# Patient Record
Sex: Female | Born: 1965 | Race: White | Hispanic: No | Smoking: Former smoker
Health system: Southern US, Community
[De-identification: ages and names within clinical notes are randomized; demographics above are authoritative.]

## PROBLEM LIST (undated history)

## (undated) DIAGNOSIS — K219 Gastro-esophageal reflux disease without esophagitis: Secondary | ICD-10-CM

## (undated) DIAGNOSIS — G43909 Migraine, unspecified, not intractable, without status migrainosus: Secondary | ICD-10-CM

## (undated) DIAGNOSIS — E785 Hyperlipidemia, unspecified: Secondary | ICD-10-CM

## (undated) HISTORY — PX: KNEE SURGERY: SHX244

## (undated) HISTORY — DX: Hyperlipidemia, unspecified: E78.5

## (undated) HISTORY — DX: Migraine, unspecified, not intractable, without status migrainosus: G43.909

## (undated) HISTORY — DX: Gastro-esophageal reflux disease without esophagitis: K21.9

---

## 2010-02-05 ENCOUNTER — Encounter: Payer: Self-pay | Admitting: Family Medicine

## 2010-02-16 ENCOUNTER — Ambulatory Visit: Payer: Self-pay | Admitting: Family Medicine

## 2010-02-16 ENCOUNTER — Encounter: Admission: RE | Admit: 2010-02-16 | Discharge: 2010-02-16 | Payer: Self-pay | Admitting: Obstetrics and Gynecology

## 2010-02-16 DIAGNOSIS — R51 Headache: Secondary | ICD-10-CM

## 2010-02-16 DIAGNOSIS — R519 Headache, unspecified: Secondary | ICD-10-CM | POA: Insufficient documentation

## 2010-02-16 DIAGNOSIS — E785 Hyperlipidemia, unspecified: Secondary | ICD-10-CM | POA: Insufficient documentation

## 2010-02-16 DIAGNOSIS — K219 Gastro-esophageal reflux disease without esophagitis: Secondary | ICD-10-CM | POA: Insufficient documentation

## 2010-02-16 DIAGNOSIS — M25539 Pain in unspecified wrist: Secondary | ICD-10-CM

## 2010-04-27 NOTE — Assessment & Plan Note (Signed)
Summary: NEW TO [facility]   Vital Signs:  Patient profile:   45 year old female Menstrual status:  regular LMP:     02/02/2010 Height:      63.5 inches Weight:      174.4 pounds BMI:     30.52 Temp:     98.4 degrees F oral BP sitting:   100 / 64  (left arm) Cuff size:   regular  Vitals Entered By: Almeta Monas CMA Duncan Dull) (February 16, 2010 2:34 PM) CC: New [facility]---- Wants to discuss Cholesterol LMP (date): 02/02/2010     Menstrual Status regular Enter LMP: 02/02/2010   History of Present Illness: Pt is here to establish and go over labs from gyn. Pt also c/o falling last week and c/o R wrist pain.  Pt fell to side with outstretched hand.       Preventive Screening-Counseling & Management  Alcohol-Tobacco     Smoking Status: quit  Caffeine-Diet-Exercise     Does Patient Exercise: yes      Drug Use:  no.    Current Medications (verified): 1)  Tylenol 325 Mg Tabs (Acetaminophen) .Marland Kitchen.. 1-2 By Mouth Every 6 Hours As Needed  Allergies (verified): No Known Drug Allergies  Past History:  Family History: Last updated: 02/16/2010 Family History of Arthritis--Father Family History Breast cancer 1st degree relative <50--Mother Family History of Colon CA 1st degree relative <60--Father Family History High cholesterol--[place] Family History of Cardiovascular disorder--Father Family History of CAD Female 1st degree relative at 46 yo  Social History: Last updated: 02/16/2010 Occupation: Divorced Former Smoker Alcohol use-no Drug use-no Regular exercise-yes  Risk Factors: Exercise: yes (02/16/2010)  Risk Factors: Smoking Status: quit (02/16/2010)  Past Medical History: GERD Headache--Migraines Hyperlipidemia  Past Surgical History: Orthoscopic Knee surgery-- left knee 1983 Impinged Uterus-- 2000  Family History: Reviewed history and no changes required. Family History of Arthritis--Father Family History Breast cancer 1st degree relative  <50--Mother Family History of Colon CA 1st degree relative <60--Father Family History High cholesterol--[place] Family History of Cardiovascular disorder--Father Family History of CAD Female 1st degree relative at 46 yo  Social History: Reviewed history and no changes required. Occupation: Divorced Former Smoker Alcohol use-no Drug use-no Regular exercise-yes Occupation:  employed Smoking Status:  quit Drug Use:  no Does Patient Exercise:  yes  Review of Systems      See HPI  Physical Exam  General:  Well-developed,well-nourished,in no acute distress; alert,appropriate and cooperative throughout examination Lungs:  Normal respiratory effort, chest expands symmetrically. Lungs are clear to auscultation, no crackles or wheezes. Heart:  normal rate and no murmur.   Msk:  normal ROM, no joint tenderness, no joint swelling, no joint warmth, no redness over joints, and no joint deformities.   Psych:  Oriented X3 and normally interactive.     Impression & Recommendations:  Problem # 1:  WRIST PAIN, RIGHT (ICD-719.43)  Orders: T-Hand Right 3 views (73130TC) T-Wrist Comp Right (73110TC) Ankle / Wrist Splint (F6213)  Problem # 2:  HYPERLIPIDEMIA (ICD-272.4) recheck labs in 3 months  Complete Medication List: 1)  Tylenol 325 Mg Tabs (Acetaminophen) .Marland Kitchen.. 1-2 by mouth every 6 hours as needed  Patient Instructions: 1)  check fasting labs in 3 months---- ov 3-4 weeks after labs 2)  272.4  [facility]   Orders Added: 1)  T-Hand Right 3 views [73130TC] 2)  T-Wrist Comp Right [73110TC] 3)  New Patient Level III [08657] 4)  Ankle / Wrist Splint [A4570]    Laboratory Results  Urine Tests      Urine HCG: negative

## 2010-05-19 ENCOUNTER — Other Ambulatory Visit (INDEPENDENT_AMBULATORY_CARE_PROVIDER_SITE_OTHER): Payer: 59

## 2010-05-19 ENCOUNTER — Encounter (INDEPENDENT_AMBULATORY_CARE_PROVIDER_SITE_OTHER): Payer: Self-pay | Admitting: *Deleted

## 2010-05-19 DIAGNOSIS — E785 Hyperlipidemia, unspecified: Secondary | ICD-10-CM

## 2010-05-19 DIAGNOSIS — Z8249 Family history of ischemic heart disease and other diseases of the circulatory system: Secondary | ICD-10-CM

## 2010-06-05 ENCOUNTER — Encounter: Payer: Self-pay | Admitting: Family Medicine

## 2010-06-17 ENCOUNTER — Ambulatory Visit: Payer: Self-pay | Admitting: Family Medicine

## 2010-06-17 DIAGNOSIS — Z0289 Encounter for other administrative examinations: Secondary | ICD-10-CM

## 2010-07-06 ENCOUNTER — Encounter: Payer: Self-pay | Admitting: Family Medicine

## 2010-07-12 ENCOUNTER — Ambulatory Visit (INDEPENDENT_AMBULATORY_CARE_PROVIDER_SITE_OTHER): Payer: 59 | Admitting: Family Medicine

## 2010-07-12 ENCOUNTER — Encounter: Payer: Self-pay | Admitting: Family Medicine

## 2010-07-12 VITALS — BP 108/70 | HR 76 | Wt 171.8 lb

## 2010-07-12 DIAGNOSIS — E785 Hyperlipidemia, unspecified: Secondary | ICD-10-CM

## 2010-07-12 NOTE — Patient Instructions (Signed)

## 2010-07-12 NOTE — Progress Notes (Signed)
  Subjective:    Patient ID: Shannon Mccarthy, female    DOB: 1965-11-13, 45 y.o.   MRN: [id number]  Hyperlipidemia This is a new problem. This is a new diagnosis. Recent lipid tests were reviewed and are high. Exacerbating diseases include obesity. She has no history of chronic renal disease, diabetes, hypothyroidism, liver disease or nephrotic syndrome. Factors aggravating her hyperlipidemia include fatty foods. Pertinent negatives include no chest pain, focal sensory loss, focal weakness, leg pain, myalgias or shortness of breath. Current antihyperlipidemic treatment includes diet change and exercise.  She started diet and exercise just prior to labs being drawn.  She is here to review labs only.    Review of Systems  Respiratory: Negative for shortness of breath.   Cardiovascular: Negative for chest pain.  Musculoskeletal: Negative for myalgias.  Neurological: Negative for focal weakness.       Objective:   Physical Exam  Constitutional: She appears well-developed and well-nourished.  Psychiatric: She has a normal mood and affect. Her behavior is normal.          Assessment & Plan:

## 2010-07-13 ENCOUNTER — Encounter: Payer: Self-pay | Admitting: Family Medicine

## 2010-10-08 ENCOUNTER — Other Ambulatory Visit: Payer: Self-pay | Admitting: *Deleted

## 2010-10-08 ENCOUNTER — Other Ambulatory Visit: Payer: Self-pay | Admitting: Family Medicine

## 2010-10-08 DIAGNOSIS — E785 Hyperlipidemia, unspecified: Secondary | ICD-10-CM

## 2010-10-08 DIAGNOSIS — E8881 Metabolic syndrome: Secondary | ICD-10-CM

## 2010-10-11 ENCOUNTER — Other Ambulatory Visit (INDEPENDENT_AMBULATORY_CARE_PROVIDER_SITE_OTHER): Payer: 59

## 2010-10-11 DIAGNOSIS — E785 Hyperlipidemia, unspecified: Secondary | ICD-10-CM

## 2010-10-11 LAB — HEPATIC FUNCTION PANEL
Albumin: 4 g/dL (ref 3.5–5.2)
Alkaline Phosphatase: 68 U/L (ref 39–117)
Bilirubin, Direct: 0 mg/dL (ref 0.0–0.3)
Total Bilirubin: 0.2 mg/dL — ABNORMAL LOW (ref 0.3–1.2)

## 2010-10-11 LAB — LIPID PANEL
LDL Cholesterol: 126 mg/dL — ABNORMAL HIGH (ref 0–99)
Total CHOL/HDL Ratio: 5
Triglycerides: 126 mg/dL (ref 0.0–149.0)

## 2010-10-11 NOTE — Progress Notes (Signed)
Labs only

## 2010-10-13 ENCOUNTER — Telehealth: Payer: Self-pay | Admitting: Family Medicine

## 2010-10-13 NOTE — Telephone Encounter (Signed)
Spoke w/ pt aware of labs and would like to wait and work on diet and exercise before starting medication.

## 2010-10-13 NOTE — Telephone Encounter (Signed)
Message copied by Doristine Devoid on Wed Oct 13, 2010  2:20 PM ------      Message from: Lelon Perla      Created: Mon Oct 11, 2010  8:29 PM       Cholesterol--- LDL goal <100,  HDL >50,  TG < 150.  Diet and exercise will increase HDL and decrease LDL and TG.  Fish,  Fish Oil, Flaxseed oil will also help increase the HDL and decrease Triglycerides.   Recheck labs in 3 months.-----start Lipitor 20 mg #30  1 po qhs,  2 refills-------272.4  Lipid, hep

## 2010-10-18 NOTE — Progress Notes (Signed)
Labs only

## 2011-05-30 ENCOUNTER — Encounter: Payer: Self-pay | Admitting: Family Medicine

## 2011-05-30 ENCOUNTER — Ambulatory Visit (INDEPENDENT_AMBULATORY_CARE_PROVIDER_SITE_OTHER): Payer: 59 | Admitting: Family Medicine

## 2011-05-30 ENCOUNTER — Other Ambulatory Visit (HOSPITAL_COMMUNITY)
Admission: RE | Admit: 2011-05-30 | Discharge: 2011-05-30 | Disposition: A | Discharge status: Home / Self Care | Payer: 59 | Source: Ambulatory Visit | Attending: Family Medicine | Admitting: Family Medicine

## 2011-05-30 VITALS — BP 116/68 | HR 75 | Temp 97.7°F | Ht 63.0 in | Wt 172.6 lb

## 2011-05-30 DIAGNOSIS — Z1239 Encounter for other screening for malignant neoplasm of breast: Secondary | ICD-10-CM

## 2011-05-30 DIAGNOSIS — Z0000 Encounter for general adult medical examination without abnormal findings: Secondary | ICD-10-CM

## 2011-05-30 DIAGNOSIS — Z23 Encounter for immunization: Secondary | ICD-10-CM

## 2011-05-30 DIAGNOSIS — E785 Hyperlipidemia, unspecified: Secondary | ICD-10-CM

## 2011-05-30 DIAGNOSIS — N760 Acute vaginitis: Secondary | ICD-10-CM

## 2011-05-30 DIAGNOSIS — Z01419 Encounter for gynecological examination (general) (routine) without abnormal findings: Secondary | ICD-10-CM | POA: Insufficient documentation

## 2011-05-30 DIAGNOSIS — R319 Hematuria, unspecified: Secondary | ICD-10-CM

## 2011-05-30 DIAGNOSIS — Z124 Encounter for screening for malignant neoplasm of cervix: Secondary | ICD-10-CM

## 2011-05-30 LAB — CBC WITH DIFFERENTIAL/PLATELET
Basophils Relative: 0.4 % (ref 0.0–3.0)
Eosinophils Absolute: 0.4 10*3/uL (ref 0.0–0.7)
MCHC: 32.8 g/dL (ref 30.0–36.0)
MCV: 95.5 fl (ref 78.0–100.0)
Monocytes Absolute: 0.6 10*3/uL (ref 0.1–1.0)
Neutro Abs: 5.7 10*3/uL (ref 1.4–7.7)
Neutrophils Relative %: 60.6 % (ref 43.0–77.0)
RBC: 4.64 Mil/uL (ref 3.87–5.11)

## 2011-05-30 LAB — BASIC METABOLIC PANEL
CO2: 24 mEq/L (ref 19–32)
Chloride: 108 mEq/L (ref 96–112)
Creatinine, Ser: 0.7 mg/dL (ref 0.4–1.2)

## 2011-05-30 LAB — LIPID PANEL
HDL: 48.6 mg/dL (ref >39.00)
Triglycerides: 98 mg/dL (ref 0.0–149.0)

## 2011-05-30 LAB — HEPATIC FUNCTION PANEL
Albumin: 3.9 g/dL (ref 3.5–5.2)
Bilirubin, Direct: 0 mg/dL (ref 0.0–0.3)
Total Protein: 6.9 g/dL (ref 6.0–8.3)

## 2011-05-30 LAB — POCT URINALYSIS DIPSTICK
Ketones, UA: NEGATIVE
Protein, UA: NEGATIVE
Spec Grav, UA: 1.02
pH, UA: 6.5

## 2011-05-30 MED ORDER — FLUCONAZOLE 150 MG PO TABS: ORAL_TABLET | ORAL | Status: DC

## 2011-05-30 NOTE — Progress Notes (Signed)
Addended by: Arnette Norris on: 05/30/2011 03:23 PM   Modules accepted: Orders

## 2011-05-30 NOTE — Progress Notes (Signed)
Addended by: Silvio Pate D on: 05/30/2011 04:21 PM   Modules accepted: Orders

## 2011-05-30 NOTE — Progress Notes (Signed)
Addended by: Silvio Pate D on: 05/30/2011 04:28 PM   Modules accepted: Orders

## 2011-05-30 NOTE — Patient Instructions (Signed)
Preventive Care for Adults, Female A healthy lifestyle and preventive care can promote health and wellness. Preventive health guidelines for women include the following key practices.  A routine yearly physical is a good way to check with your caregiver about your health and preventive screening. It is a chance to share any concerns and updates on your health, and to receive a thorough exam.   Visit your dentist for a routine exam and preventive care every 6 months. Brush your teeth twice a day and floss once a day. Good oral hygiene prevents tooth decay and gum disease.   The frequency of eye exams is based on your age, health, family medical history, use of contact lenses, and other factors. Follow your caregiver's recommendations for frequency of eye exams.   Eat a healthy diet. Foods like vegetables, fruits, whole grains, low-fat dairy products, and lean protein foods contain the nutrients you need without too many calories. Decrease your intake of foods high in solid fats, added sugars, and salt. Eat the right amount of calories for you.Get information about a proper diet from your caregiver, if necessary.   Regular physical exercise is one of the most important things you can do for your health. Most adults should get at least 150 minutes of moderate-intensity exercise (any activity that increases your heart rate and causes you to sweat) each week. In addition, most adults need muscle-strengthening exercises on 2 or more days a week.   Maintain a healthy weight. The body mass index (BMI) is a screening tool to identify possible weight problems. It provides an estimate of body fat based on height and weight. Your caregiver can help determine your BMI, and can help you achieve or maintain a healthy weight.For adults 20 years and older:   A BMI below 18.5 is considered underweight.   A BMI of 18.5 to 24.9 is normal.   A BMI of 25 to 29.9 is considered overweight.   A BMI of 30 and above is  considered obese.   Maintain normal blood lipids and cholesterol levels by exercising and minimizing your intake of saturated fat. Eat a balanced diet with plenty of fruit and vegetables. Blood tests for lipids and cholesterol should begin at age 20 and be repeated every 5 years. If your lipid or cholesterol levels are high, you are over 50, or you are at high risk for heart disease, you may need your cholesterol levels checked more frequently.Ongoing high lipid and cholesterol levels should be treated with medicines if diet and exercise are not effective.   If you smoke, find out from your caregiver how to quit. If you do not use tobacco, do not start.   If you are pregnant, do not drink alcohol. If you are breastfeeding, be very cautious about drinking alcohol. If you are not pregnant and choose to drink alcohol, do not exceed 1 drink per day. One drink is considered to be 12 ounces (355 mL) of beer, 5 ounces (148 mL) of wine, or 1.5 ounces (44 mL) of liquor.   Avoid use of street drugs. Do not share needles with anyone. Ask for help if you need support or instructions about stopping the use of drugs.   High blood pressure causes heart disease and increases the risk of stroke. Your blood pressure should be checked at least every 1 to 2 years. Ongoing high blood pressure should be treated with medicines if weight loss and exercise are not effective.   If you are 55 to 47   years old, ask your caregiver if you should take aspirin to prevent strokes.   Diabetes screening involves taking a blood sample to check your fasting blood sugar level. This should be done once every 3 years, after age 45, if you are within normal weight and without risk factors for diabetes. Testing should be considered at a younger age or be carried out more frequently if you are overweight and have at least 1 risk factor for diabetes.   Breast cancer screening is essential preventive care for women. You should practice "breast  self-awareness." This means understanding the normal appearance and feel of your breasts and may include breast self-examination. Any changes detected, no matter how small, should be reported to a caregiver. Women in their 20s and 30s should have a clinical breast exam (CBE) by a caregiver as part of a regular health exam every 1 to 3 years. After age 40, women should have a CBE every year. Starting at age 40, women should consider having a mammography (breast X-ray test) every year. Women who have a family history of breast cancer should talk to their caregiver about genetic screening. Women at a high risk of breast cancer should talk to their caregivers about having magnetic resonance imaging (MRI) and a mammography every year.   The Pap test is a screening test for cervical cancer. A Pap test can show cell changes on the cervix that might become cervical cancer if left untreated. A Pap test is a procedure in which cells are obtained and examined from the lower end of the uterus (cervix).   Women should have a Pap test starting at age 21.   Between ages 21 and 29, Pap tests should be repeated every 2 years.   Beginning at age 30, you should have a Pap test every 3 years as long as the past 3 Pap tests have been normal.   Some women have medical problems that increase the chance of getting cervical cancer. Talk to your caregiver about these problems. It is especially important to talk to your caregiver if a new problem develops soon after your last Pap test. In these cases, your caregiver may recommend more frequent screening and Pap tests.   The above recommendations are the same for women who have or have not gotten the vaccine for human papillomavirus (HPV).   If you had a hysterectomy for a problem that was not cancer or a condition that could lead to cancer, then you no longer need Pap tests. Even if you no longer need a Pap test, a regular exam is a good idea to make sure no other problems are  starting.   If you are between ages 65 and 70, and you have had normal Pap tests going back 10 years, you no longer need Pap tests. Even if you no longer need a Pap test, a regular exam is a good idea to make sure no other problems are starting.   If you have had past treatment for cervical cancer or a condition that could lead to cancer, you need Pap tests and screening for cancer for at least 20 years after your treatment.   If Pap tests have been discontinued, risk factors (such as a new sexual partner) need to be reassessed to determine if screening should be resumed.   The HPV test is an additional test that may be used for cervical cancer screening. The HPV test looks for the virus that can cause the cell changes on the cervix.   The cells collected during the Pap test can be tested for HPV. The HPV test could be used to screen women aged 30 years and older, and should be used in women of any age who have unclear Pap test results. After the age of 30, women should have HPV testing at the same frequency as a Pap test.   Colorectal cancer can be detected and often prevented. Most routine colorectal cancer screening begins at the age of 50 and continues through age 75. However, your caregiver may recommend screening at an earlier age if you have risk factors for colon cancer. On a yearly basis, your caregiver may provide home test kits to check for hidden blood in the stool. Use of a small camera at the end of a tube, to directly examine the colon (sigmoidoscopy or colonoscopy), can detect the earliest forms of colorectal cancer. Talk to your caregiver about this at age 50, when routine screening begins. Direct examination of the colon should be repeated every 5 to 10 years through age 75, unless early forms of pre-cancerous polyps or small growths are found.   Hepatitis C blood testing is recommended for all people born from 1945 through 1965 and any individual with known risks for hepatitis C.    Practice safe sex. Use condoms and avoid high-risk sexual practices to reduce the spread of sexually transmitted infections (STIs). STIs include gonorrhea, chlamydia, syphilis, trichomonas, herpes, HPV, and human immunodeficiency virus (HIV). Herpes, HIV, and HPV are viral illnesses that have no cure. They can result in disability, cancer, and death. Sexually active women aged 25 and younger should be checked for chlamydia. Older women with new or multiple partners should also be tested for chlamydia. Testing for other STIs is recommended if you are sexually active and at increased risk.   Osteoporosis is a disease in which the bones lose minerals and strength with aging. This can result in serious bone fractures. The risk of osteoporosis can be identified using a bone density scan. Women ages 65 and over and women at risk for fractures or osteoporosis should discuss screening with their caregivers. Ask your caregiver whether you should take a calcium supplement or vitamin D to reduce the rate of osteoporosis.   Menopause can be associated with physical symptoms and risks. Hormone replacement therapy is available to decrease symptoms and risks. You should talk to your caregiver about whether hormone replacement therapy is right for you.   Use sunscreen with sun protection factor (SPF) of 30 or more. Apply sunscreen liberally and repeatedly throughout the day. You should seek shade when your shadow is shorter than you. Protect yourself by wearing long sleeves, pants, a wide-brimmed hat, and sunglasses year round, whenever you are outdoors.   Once a month, do a whole body skin exam, using a mirror to look at the skin on your back. Notify your caregiver of new moles, moles that have irregular borders, moles that are larger than a pencil eraser, or moles that have changed in shape or color.   Stay current with required immunizations.   Influenza. You need a dose every fall (or winter). The composition of  the flu vaccine changes each year, so being vaccinated once is not enough.   Pneumococcal polysaccharide. You need 1 to 2 doses if you smoke cigarettes or if you have certain chronic medical conditions. You need 1 dose at age 65 (or older) if you have never been vaccinated.   Tetanus, diphtheria, pertussis (Tdap, Td). Get 1 dose of   Tdap vaccine if you are younger than age 65, are over 65 and have contact with an infant, are a healthcare worker, are pregnant, or simply want to be protected from whooping cough. After that, you need a Td booster dose every 10 years. Consult your caregiver if you have not had at least 3 tetanus and diphtheria-containing shots sometime in your life or have a deep or dirty wound.   HPV. You need this vaccine if you are a woman age 26 or younger. The vaccine is given in 3 doses over 6 months.   Measles, mumps, rubella (MMR). You need at least 1 dose of MMR if you were born in 1957 or later. You may also need a second dose.   Meningococcal. If you are age 19 to 21 and a first-year college student living in a residence hall, or have one of several medical conditions, you need to get vaccinated against meningococcal disease. You may also need additional booster doses.   Zoster (shingles). If you are age 60 or older, you should get this vaccine.   Varicella (chickenpox). If you have never had chickenpox or you were vaccinated but received only 1 dose, talk to your caregiver to find out if you need this vaccine.   Hepatitis A. You need this vaccine if you have a specific risk factor for hepatitis A virus infection or you simply wish to be protected from this disease. The vaccine is usually given as 2 doses, 6 to 18 months apart.   Hepatitis B. You need this vaccine if you have a specific risk factor for hepatitis B virus infection or you simply wish to be protected from this disease. The vaccine is given in 3 doses, usually over 6 months.  Preventive Services /  Frequency Ages 19 to 39  Blood pressure check.** / Every 1 to 2 years.   Lipid and cholesterol check.** / Every 5 years beginning at age 20.   Clinical breast exam.** / Every 3 years for women in their 20s and 30s.   Pap test.** / Every 2 years from ages 21 through 29. Every 3 years starting at age 30 through age 65 or 70 with a history of 3 consecutive normal Pap tests.   HPV screening.** / Every 3 years from ages 30 through ages 65 to 70 with a history of 3 consecutive normal Pap tests.   Hepatitis C blood test.** / For any individual with known risks for hepatitis C.   Skin self-exam. / Monthly.   Influenza immunization.** / Every year.   Pneumococcal polysaccharide immunization.** / 1 to 2 doses if you smoke cigarettes or if you have certain chronic medical conditions.   Tetanus, diphtheria, pertussis (Tdap, Td) immunization. / A one-time dose of Tdap vaccine. After that, you need a Td booster dose every 10 years.   HPV immunization. / 3 doses over 6 months, if you are 26 and younger.   Measles, mumps, rubella (MMR) immunization. / You need at least 1 dose of MMR if you were born in 1957 or later. You may also need a second dose.   Meningococcal immunization. / 1 dose if you are age 19 to 21 and a first-year college student living in a residence hall, or have one of several medical conditions, you need to get vaccinated against meningococcal disease. You may also need additional booster doses.   Varicella immunization.** / Consult your caregiver.   Hepatitis A immunization.** / Consult your caregiver. 2 doses, 6 to 18 months   apart.   Hepatitis B immunization.** / Consult your caregiver. 3 doses usually over 6 months.  Ages 40 to 64  Blood pressure check.** / Every 1 to 2 years.   Lipid and cholesterol check.** / Every 5 years beginning at age 20.   Clinical breast exam.** / Every year after age 40.   Mammogram.** / Every year beginning at age 40 and continuing for as  long as you are in good health. Consult with your caregiver.   Pap test.** / Every 3 years starting at age 30 through age 65 or 70 with a history of 3 consecutive normal Pap tests.   HPV screening.** / Every 3 years from ages 30 through ages 65 to 70 with a history of 3 consecutive normal Pap tests.   Fecal occult blood test (FOBT) of stool. / Every year beginning at age 50 and continuing until age 75. You may not need to do this test if you get a colonoscopy every 10 years.   Flexible sigmoidoscopy or colonoscopy.** / Every 5 years for a flexible sigmoidoscopy or every 10 years for a colonoscopy beginning at age 50 and continuing until age 75.   Hepatitis C blood test.** / For all people born from 1945 through 1965 and any individual with known risks for hepatitis C.   Skin self-exam. / Monthly.   Influenza immunization.** / Every year.   Pneumococcal polysaccharide immunization.** / 1 to 2 doses if you smoke cigarettes or if you have certain chronic medical conditions.   Tetanus, diphtheria, pertussis (Tdap, Td) immunization.** / A one-time dose of Tdap vaccine. After that, you need a Td booster dose every 10 years.   Measles, mumps, rubella (MMR) immunization. / You need at least 1 dose of MMR if you were born in 1957 or later. You may also need a second dose.   Varicella immunization.** / Consult your caregiver.   Meningococcal immunization.** / Consult your caregiver.   Hepatitis A immunization.** / Consult your caregiver. 2 doses, 6 to 18 months apart.   Hepatitis B immunization.** / Consult your caregiver. 3 doses, usually over 6 months.  Ages 65 and over  Blood pressure check.** / Every 1 to 2 years.   Lipid and cholesterol check.** / Every 5 years beginning at age 20.   Clinical breast exam.** / Every year after age 40.   Mammogram.** / Every year beginning at age 40 and continuing for as long as you are in good health. Consult with your caregiver.   Pap test.** /  Every 3 years starting at age 30 through age 65 or 70 with a 3 consecutive normal Pap tests. Testing can be stopped between 65 and 70 with 3 consecutive normal Pap tests and no abnormal Pap or HPV tests in the past 10 years.   HPV screening.** / Every 3 years from ages 30 through ages 65 or 70 with a history of 3 consecutive normal Pap tests. Testing can be stopped between 65 and 70 with 3 consecutive normal Pap tests and no abnormal Pap or HPV tests in the past 10 years.   Fecal occult blood test (FOBT) of stool. / Every year beginning at age 50 and continuing until age 75. You may not need to do this test if you get a colonoscopy every 10 years.   Flexible sigmoidoscopy or colonoscopy.** / Every 5 years for a flexible sigmoidoscopy or every 10 years for a colonoscopy beginning at age 50 and continuing until age 75.   Hepatitis   C blood test.** / For all people born from 1945 through 1965 and any individual with known risks for hepatitis C.   Osteoporosis screening.** / A one-time screening for women ages 65 and over and women at risk for fractures or osteoporosis.   Skin self-exam. / Monthly.   Influenza immunization.** / Every year.   Pneumococcal polysaccharide immunization.** / 1 dose at age 65 (or older) if you have never been vaccinated.   Tetanus, diphtheria, pertussis (Tdap, Td) immunization. / A one-time dose of Tdap vaccine if you are over 65 and have contact with an infant, are a healthcare worker, or simply want to be protected from whooping cough. After that, you need a Td booster dose every 10 years.   Varicella immunization.** / Consult your caregiver.   Meningococcal immunization.** / Consult your caregiver.   Hepatitis A immunization.** / Consult your caregiver. 2 doses, 6 to 18 months apart.   Hepatitis B immunization.** / Check with your caregiver. 3 doses, usually over 6 months.  ** Family history and personal history of risk and conditions may change your caregiver's  recommendations. Document Released: 05/10/2001 Document Revised: 03/03/2011 Document Reviewed: 08/09/2010 [employer] Patient Information 2012 [employer]. 

## 2011-05-30 NOTE — Progress Notes (Signed)
Subjective:     Shannon Mccarthy is a 46 y.o. female and is here for a comprehensive physical exam. The patient reports no problems.  History   Social History  . Marital Status: Divorced    Spouse Name: N/A    Number of Children: N/A  . Years of Education: N/A   Occupational History  . [employer]--[employer]    Social History Main Topics  . Smoking status: Former Smoker    Quit date: 05/29/2009  . Smokeless tobacco: Never Used   Comment: smoked 1 cig a day  . Alcohol Use: No  . Drug Use: No  . Sexually Active: Yes -- Female partner(s)   Other Topics Concern  . 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 Tetanus/tdap  01/01/1984  . Mammogram  02/17/2011  . Influenza Vaccine  12/27/2011  . Pap Smear  05/30/2014    The following portions of the patient's history were reviewed and updated as appropriate: allergies, current medications, past family history, past medical history, past social history, past surgical history and problem list.  Review of Systems Review of Systems  Constitutional: Negative for activity change, appetite change and fatigue.  HENT: Negative for hearing loss, congestion, tinnitus and ear discharge.  dentist q48m Eyes: Negative for visual disturbance (see optho q1y -- vision corrected to 20/20 with glasses).  Respiratory: Negative for cough, chest tightness and shortness of breath.   Cardiovascular: Negative for chest pain, palpitations and leg swelling.  Gastrointestinal: Negative for abdominal pain, diarrhea, constipation and abdominal distention.  Genitourinary: Negative for urgency, frequency, decreased urine volume and difficulty urinating.  Musculoskeletal: Negative for back pain, arthralgias and gait problem.  Skin: Negative for color change, pallor and rash.  Neurological: Negative for dizziness, light-headedness, numbness and headaches.  Hematological: Negative for adenopathy.  Does not bruise/bleed easily.  Psychiatric/Behavioral: Negative for suicidal ideas, confusion, sleep disturbance, self-injury, dysphoric mood, decreased concentration and agitation.       Objective:    BP 116/68  Pulse 75  Temp(Src) 97.7 F (36.5 C) (Oral)  Ht 5\' 3"  (1.6 m)  Wt 172 lb 9.6 oz (78.291 kg)  BMI 30.57 kg/m2  SpO2 98%  LMP 05/16/2011 General appearance: alert, cooperative, appears stated age and no distress Head: Normocephalic, without obvious abnormality, atraumatic Eyes: conjunctivae/corneas clear. PERRL, EOM's intact. Fundi benign. Ears: normal TM's and external ear canals both ears Nose: Nares normal. Septum midline. Mucosa normal. No drainage or sinus tenderness. Throat: lips, mucosa, and tongue normal; teeth and gums normal Neck: no adenopathy, no carotid bruit, no JVD, supple, symmetrical, trachea midline and thyroid not enlarged, symmetric, no tenderness/mass/nodules Back: symmetric, no curvature. ROM normal. No CVA tenderness. Lungs: clear to auscultation bilaterally Breasts: normal appearance, no masses or tenderness Heart: regular rate and rhythm, S1, S2 normal, no murmur, click, rub or gallop Abdomen: soft, non-tender; bowel sounds normal; no masses,  no organomegaly Pelvic: no ext lesions-- + thick d/c c/w yeast--- bme-- no cmt, no adnexal tenderness or masses--pap done Extremities: extremities normal, atraumatic, no cyanosis or edema Pulses: 2+ and symmetric Skin: Skin color, texture, turgor normal. No rashes or lesions Lymph nodes: Cervical, supraclavicular, and axillary nodes normal. Neurologic: Alert and oriented X 3, normal strength and tone. Normal symmetric reflexes. Normal coordination and gait psych--no suicidal, no depression    Assessment:    Healthy female exam.    hyperlpidemia Vaginal candidiasis---diflucan  Plan:  ghm utd Check labs See After Visit Summary for Counseling Recommendations

## 2011-05-30 NOTE — Progress Notes (Signed)
Addended by: Silvio Pate D on: 05/30/2011 04:48 PM   Modules accepted: Orders

## 2011-05-31 ENCOUNTER — Telehealth: Payer: Self-pay | Admitting: Family Medicine

## 2011-05-31 DIAGNOSIS — Z1239 Encounter for other screening for malignant neoplasm of breast: Secondary | ICD-10-CM

## 2011-05-31 DIAGNOSIS — R928 Other abnormal and inconclusive findings on diagnostic imaging of breast: Secondary | ICD-10-CM

## 2011-05-31 LAB — LDL CHOLESTEROL, DIRECT: Direct LDL: 149.6 mg/dL

## 2011-05-31 NOTE — Telephone Encounter (Signed)
Addended by: Candie Echevaria L on: 05/31/2011 11:26 AM   Modules accepted: Orders

## 2011-05-31 NOTE — Telephone Encounter (Signed)
Ordered placed in systems.

## 2011-05-31 NOTE — Telephone Encounter (Signed)
Addended byDuaine Dredge, Jasnoor Trussell L on: 05/31/2011 11:19 AM   Modules accepted: Orders

## 2011-05-31 NOTE — Telephone Encounter (Signed)
Cara from the [facility] called & stated that since patient did not due th follow up visit from the original mammogram she would need the order to be:  Diagnostic Bilateral with Ultrasound Left Breast  Cara ph# [phone]

## 2011-06-02 MED ORDER — ATORVASTATIN CALCIUM 10 MG PO TABS: 10.0000 mg | ORAL_TABLET | Freq: Every day | ORAL | Status: AC

## 2011-06-02 NOTE — Telephone Encounter (Signed)
Addended by: Candie Echevaria L on: 06/02/2011 01:14 PM   Modules accepted: Orders

## 2011-06-13 ENCOUNTER — Ambulatory Visit
Admission: RE | Admit: 2011-06-13 | Discharge: 2011-06-13 | Disposition: A | Discharge status: Home / Self Care | Payer: 59 | Source: Ambulatory Visit | Attending: Family Medicine | Admitting: Family Medicine

## 2011-06-13 DIAGNOSIS — R928 Other abnormal and inconclusive findings on diagnostic imaging of breast: Secondary | ICD-10-CM

## 2011-10-28 IMAGING — CR DG HAND COMPLETE 3+V*R*
3 series · 3 of 3 positions shown · non-contrast
Comparison: None.

CLINICAL DATA: Fall 1 week prior.

RIGHT HAND - COMPLETE 3+ VIEW

[view not recorded (1 of 3)]
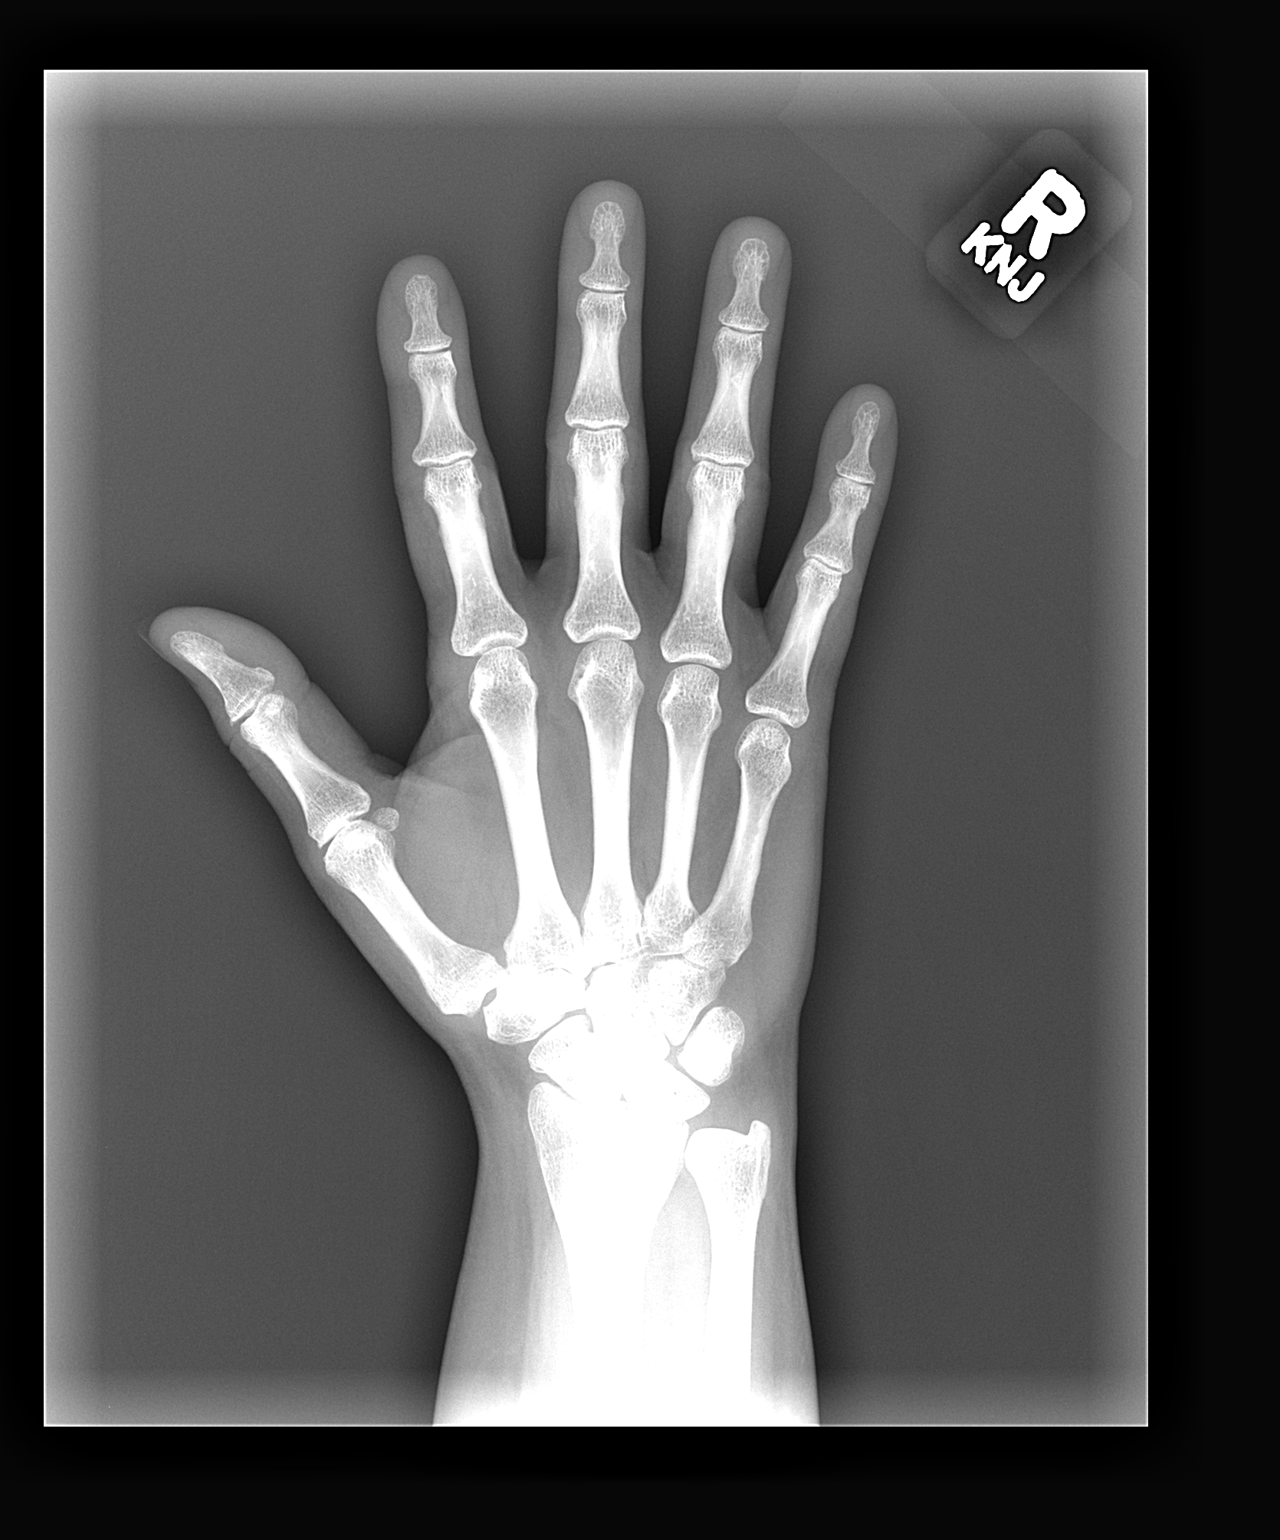

[view not recorded (2 of 3)]
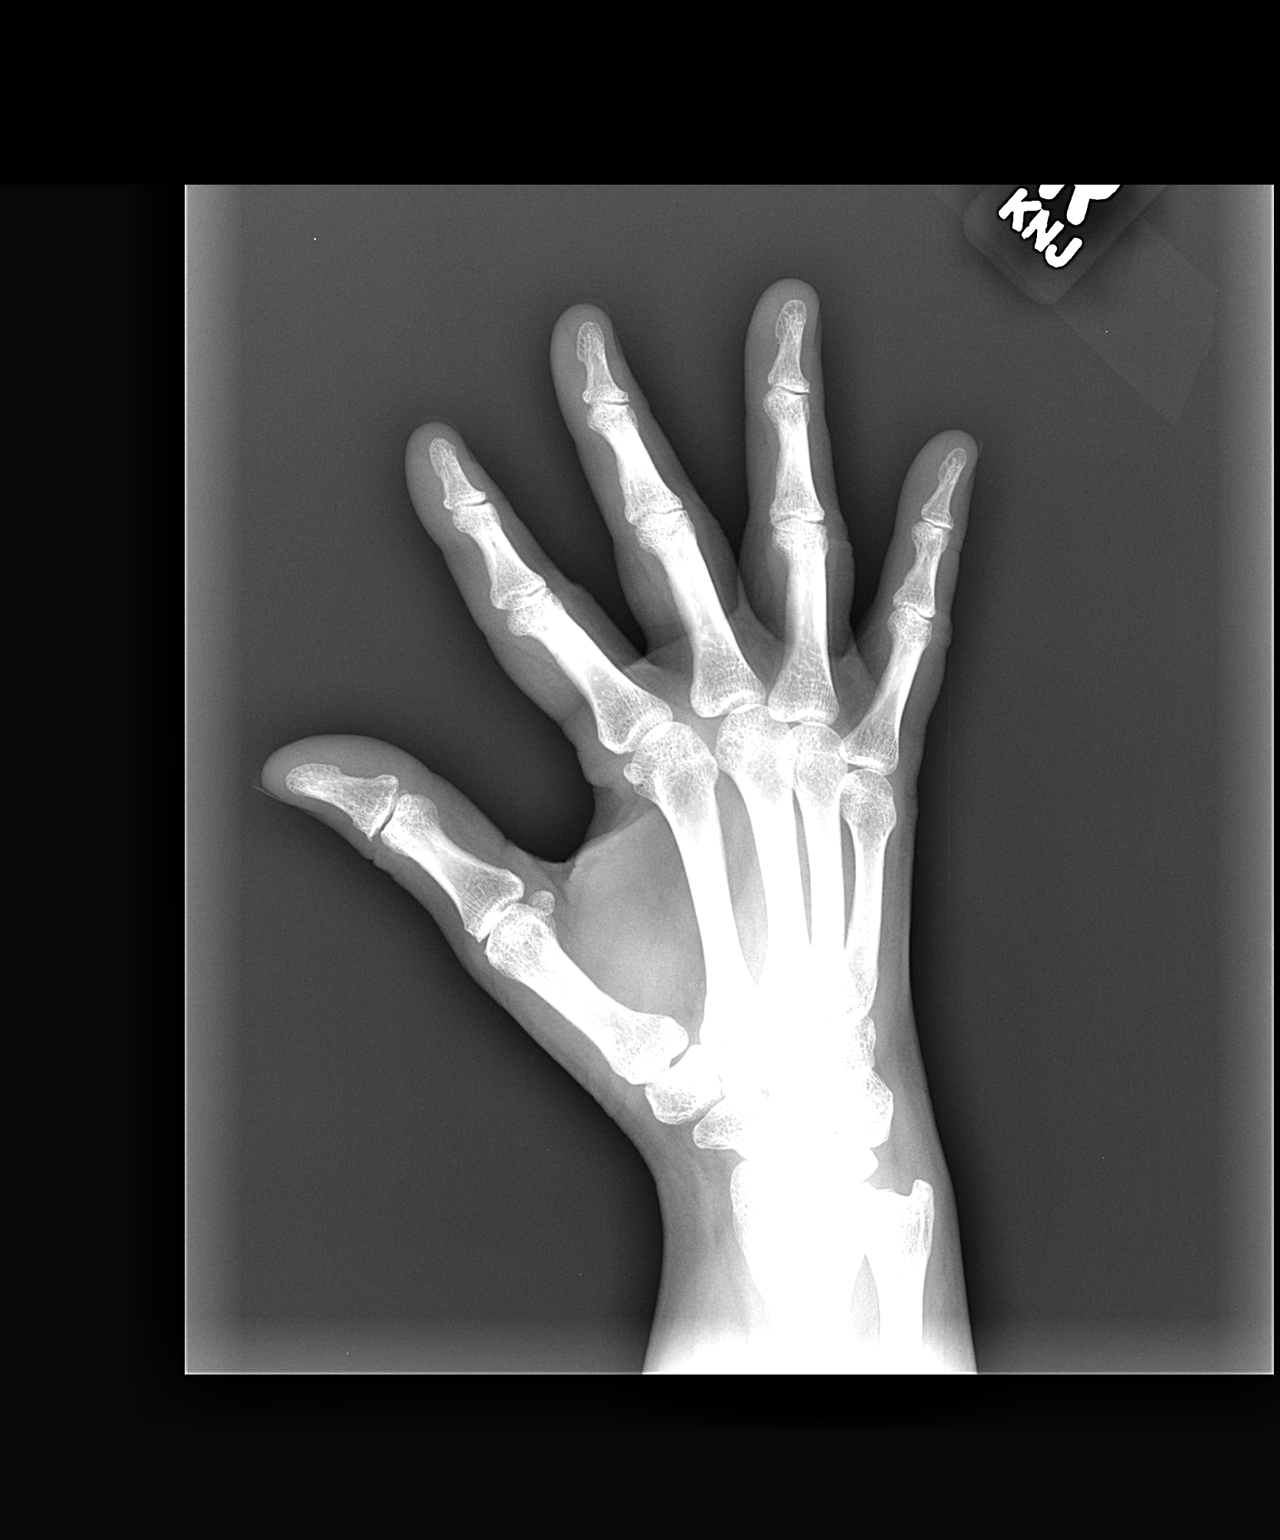

[view not recorded (3 of 3)]
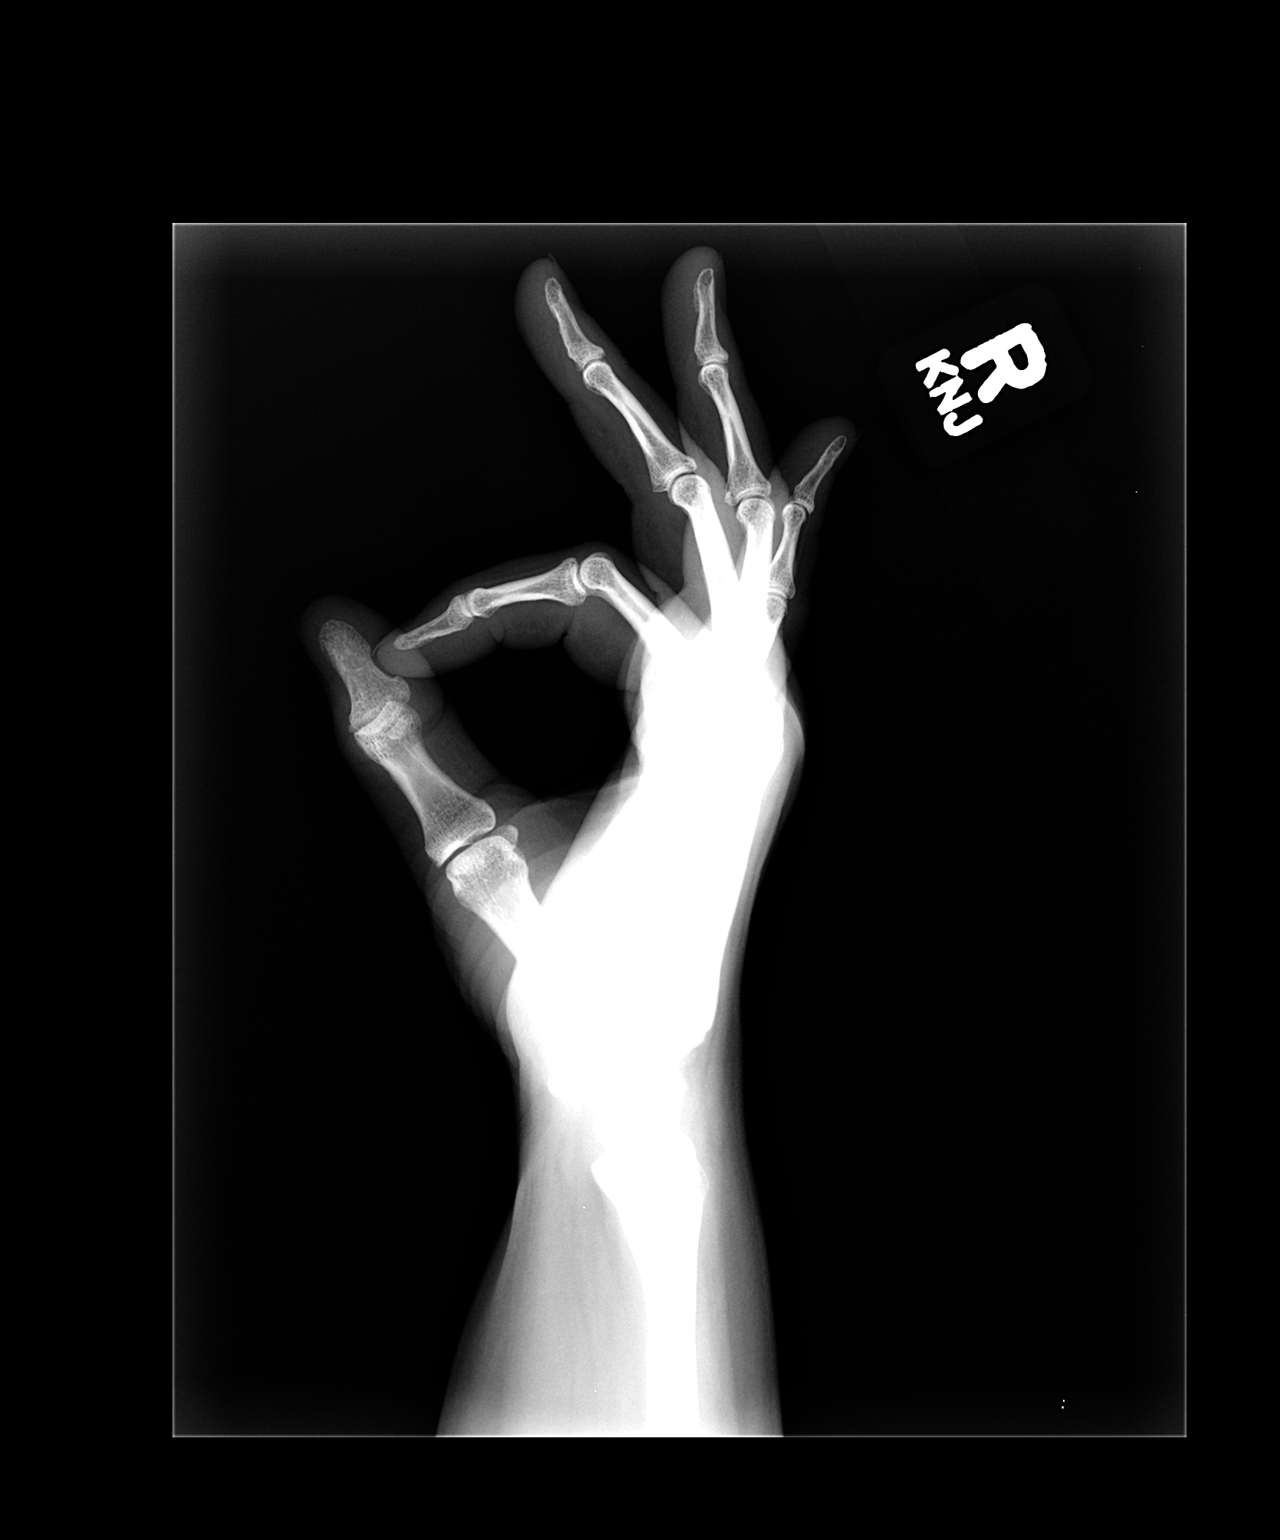

[3 of 3 positions shown; findings below may reference images not displayed]

FINDINGS: There is no acute fracture or dislocation.  Carpal
alignment is maintained.  Soft tissues are normal.
IMPRESSION: No acute fracture or dislocation.

## 2011-10-28 IMAGING — CR DG WRIST COMPLETE 3+V*R*
2 series · 2 of 2 positions shown · non-contrast
Comparison: None.

CLINICAL DATA: Wrist pain.  Fall 1 week prior.

RIGHT WRIST - COMPLETE 3+ VIEW

[view not recorded (1 of 2)]
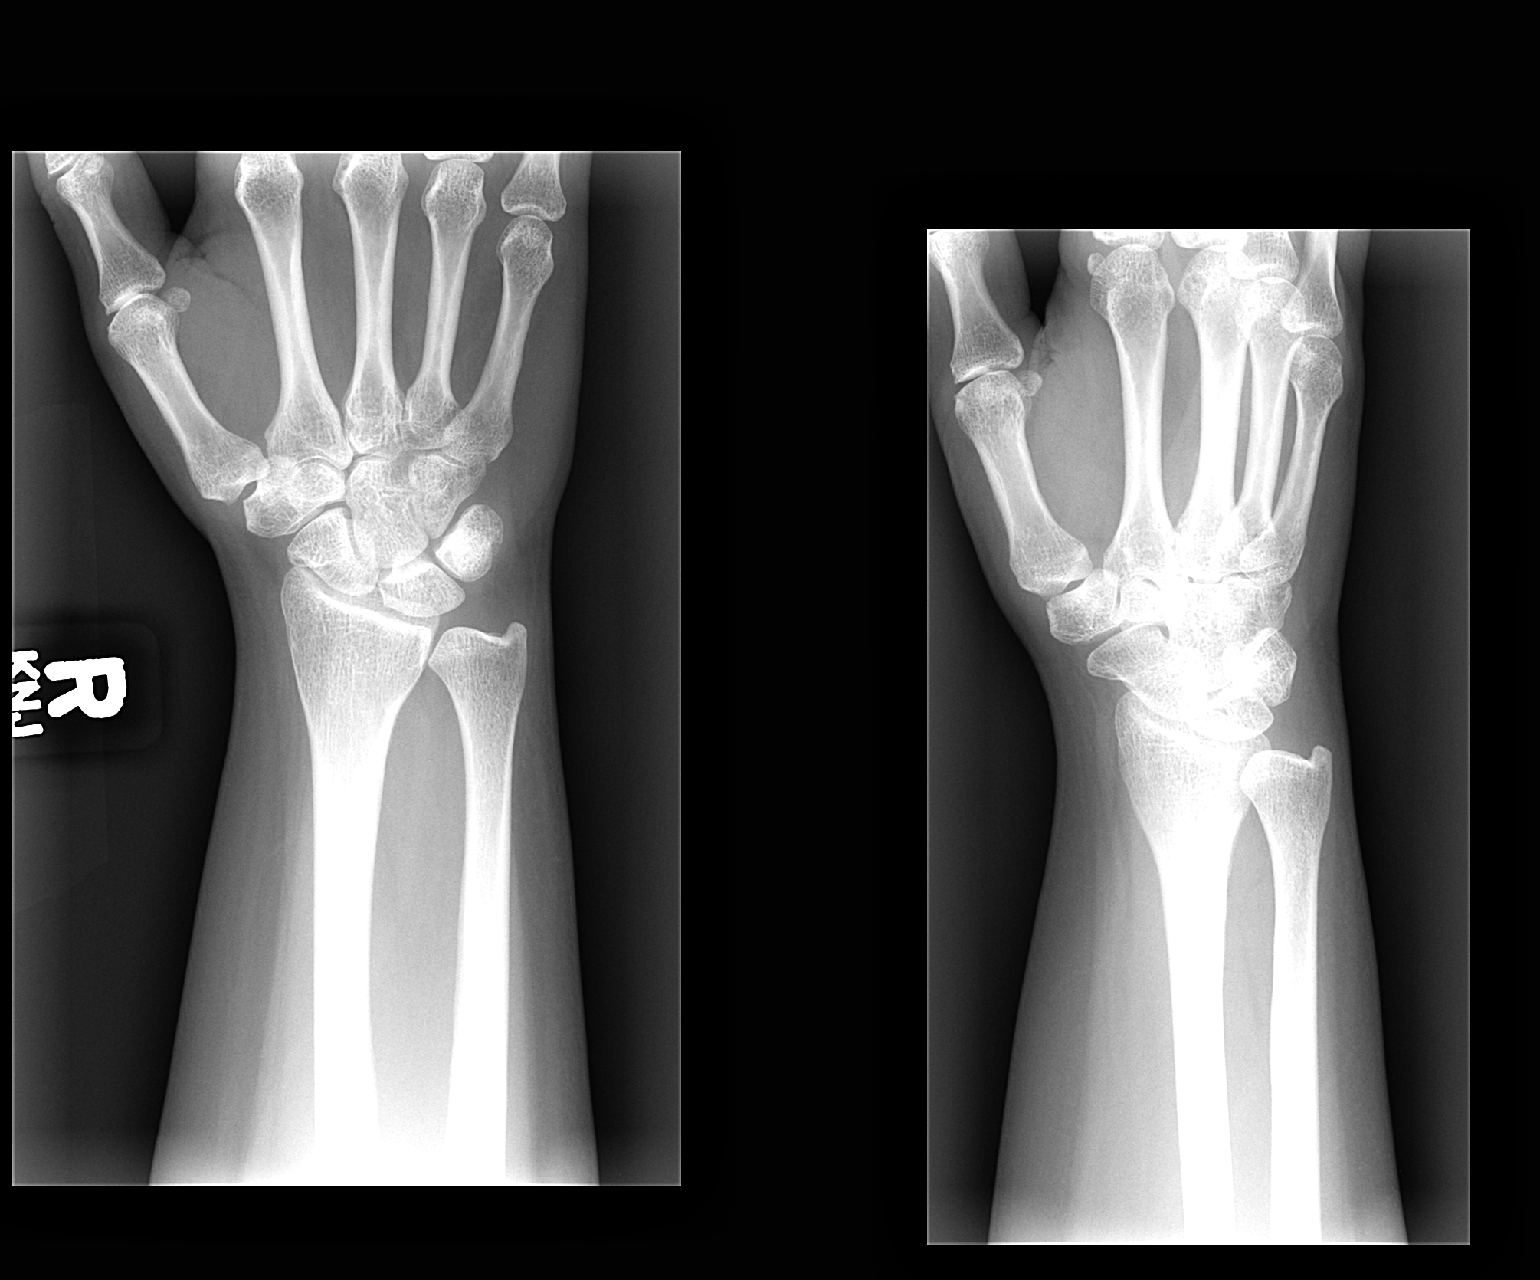

[view not recorded (2 of 2)]
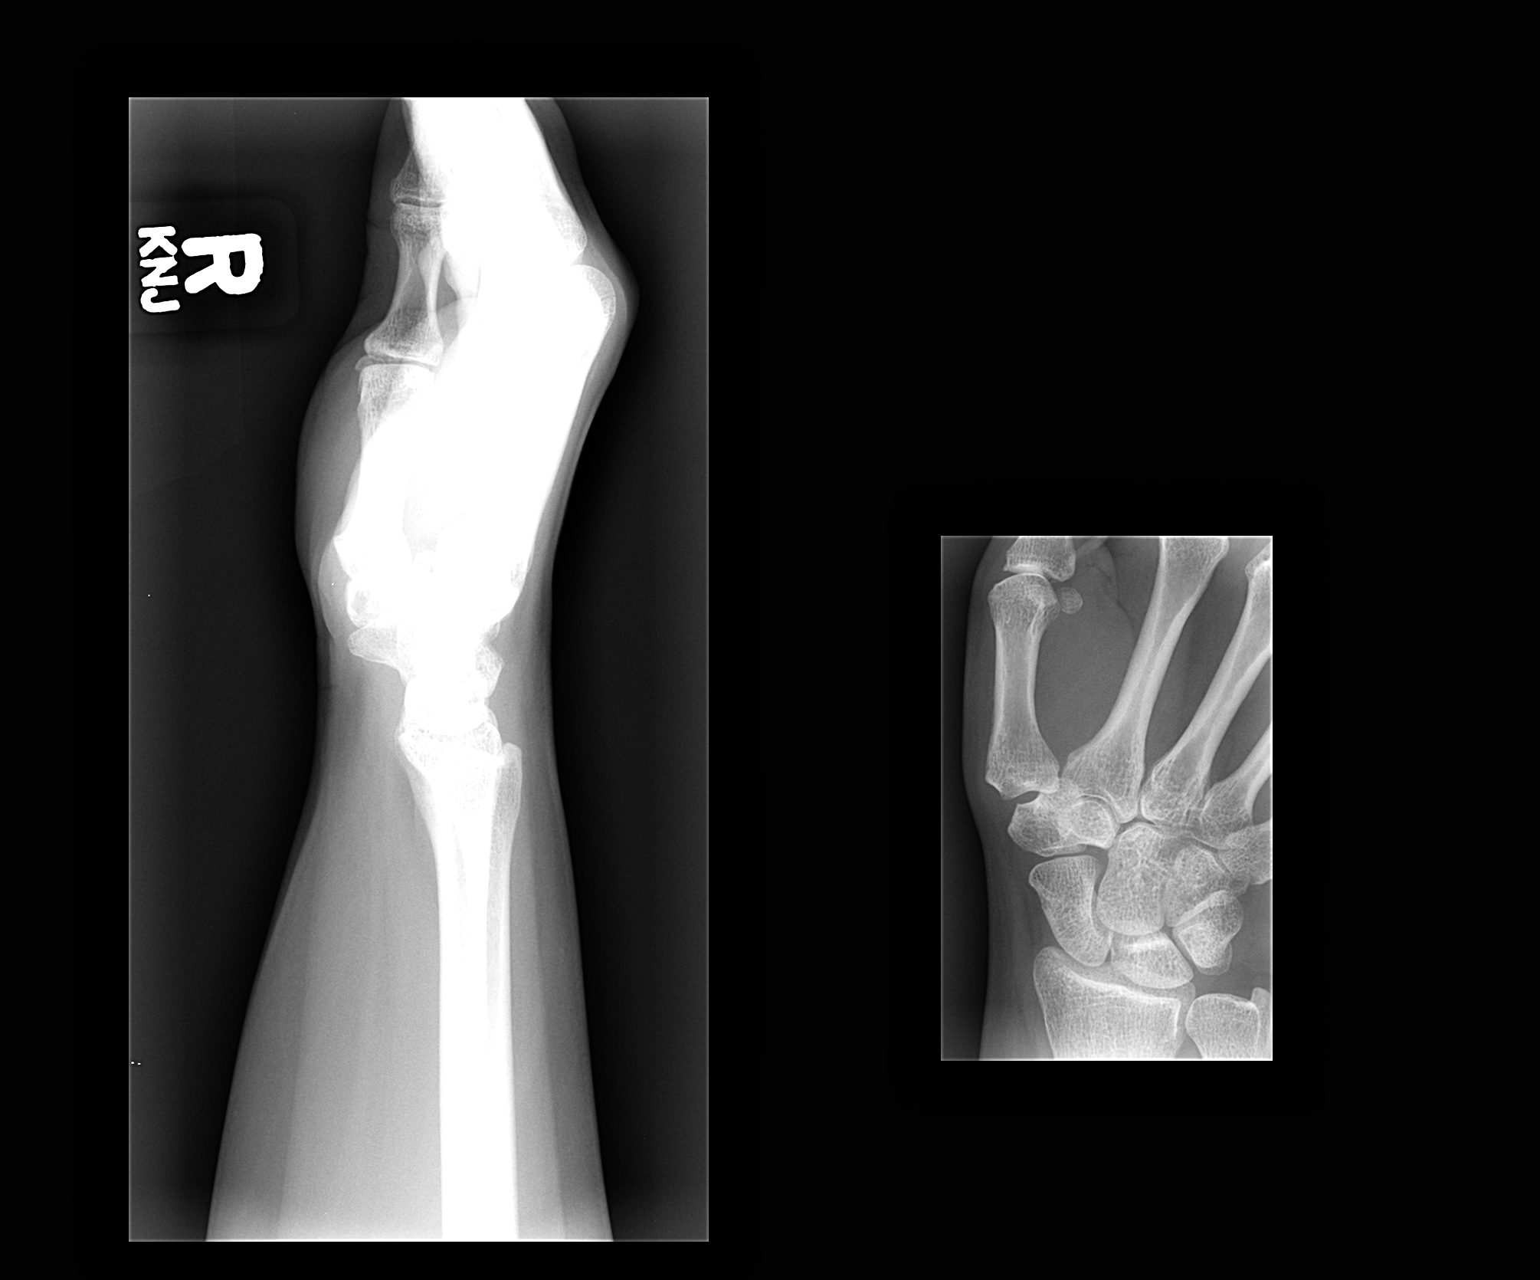

[2 of 2 positions shown; findings below may reference images not displayed]

FINDINGS: There is no evidence of acute fracture or dislocation.
Carpal alignment is maintained.  The soft tissues are unremarkable.
Note made of ulnar minus variance.
IMPRESSION: No acute fracture or dislocation.

## 2012-08-21 ENCOUNTER — Encounter: Payer: Self-pay | Admitting: Internal Medicine

## 2012-08-21 ENCOUNTER — Ambulatory Visit (INDEPENDENT_AMBULATORY_CARE_PROVIDER_SITE_OTHER): Payer: Federal, State, Local not specified - PPO | Admitting: Internal Medicine

## 2012-08-21 VITALS — BP 98/66 | HR 84 | Temp 97.7°F | Wt 169.6 lb

## 2012-08-21 DIAGNOSIS — L255 Unspecified contact dermatitis due to plants, except food: Secondary | ICD-10-CM

## 2012-08-21 MED ORDER — MOMETASONE FUROATE 0.1 % EX OINT: TOPICAL_OINTMENT | Freq: Two times a day (BID) | CUTANEOUS | Status: DC

## 2012-08-21 MED ORDER — HYDROXYZINE HCL 10 MG PO TABS: 10.0000 mg | ORAL_TABLET | Freq: Four times a day (QID) | ORAL | Status: DC | PRN

## 2012-08-21 MED ORDER — PREDNISONE 20 MG PO TABS: 20.0000 mg | ORAL_TABLET | Freq: Two times a day (BID) | ORAL | Status: DC

## 2012-08-21 NOTE — Patient Instructions (Addendum)
Plain Allegra (NOT D )  160 during day  as needed for itching

## 2012-08-21 NOTE — Progress Notes (Signed)
  Subjective:    Patient ID: Shannon Mccarthy, female    DOB: 1965/09/17, 47 y.o.   MRN: [id number]  HPI  Symptoms began 10-11 days ago as rash on extremities after yard work 08/11/12.  The itching was treated with calamine with variable response. Benadryl has been used at home; she cannot take it at work because of excessive somnolence  The rash has progressed despite using an over-the-counter wash and cortisone cream recommended by the pharmacist.  She has a history of significant allergies which include mangoes and insect venom.    Review of Systems She denies fever, chills, sweats, wheezing, or angioedema.  She has had some sore throat; she denies frontal headache, facial pain, or nasal purulence.     Objective:   Physical Exam  She appears healthy and well-nourished in no distress  Nares are patent. No oral pharyngeal exudates are noted  Chest is clear with no shortness of breath or wheezing  Cardiac rhythm is regular without significant murmur.  She has evidence of Rhus dermatitis lesions and urticarial reactions worse over the trunk. 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

## 2012-12-17 ENCOUNTER — Encounter: Payer: Self-pay | Admitting: Family Medicine

## 2012-12-17 ENCOUNTER — Ambulatory Visit (INDEPENDENT_AMBULATORY_CARE_PROVIDER_SITE_OTHER): Payer: Federal, State, Local not specified - PPO | Admitting: Family Medicine

## 2012-12-17 VITALS — BP 110/74 | HR 68 | Temp 98.2°F | Wt 180.0 lb

## 2012-12-17 DIAGNOSIS — J309 Allergic rhinitis, unspecified: Secondary | ICD-10-CM

## 2012-12-17 DIAGNOSIS — R5381 Other malaise: Secondary | ICD-10-CM

## 2012-12-17 DIAGNOSIS — J302 Other seasonal allergic rhinitis: Secondary | ICD-10-CM

## 2012-12-17 DIAGNOSIS — J020 Streptococcal pharyngitis: Secondary | ICD-10-CM

## 2012-12-17 DIAGNOSIS — J029 Acute pharyngitis, unspecified: Secondary | ICD-10-CM

## 2012-12-17 MED ORDER — FLUTICASONE PROPIONATE 50 MCG/ACT NA SUSP: 2.0000 | Freq: Every day | NASAL | Status: DC

## 2012-12-17 MED ORDER — LORATADINE 10 MG PO TABS: 10.0000 mg | ORAL_TABLET | Freq: Every day | ORAL | Status: AC

## 2012-12-17 MED ORDER — LORATADINE 10 MG PO TABS: 10.0000 mg | ORAL_TABLET | Freq: Every day | ORAL | Status: DC

## 2012-12-17 NOTE — Progress Notes (Signed)
  Subjective:     Shannon Mccarthy is a 47 y.o. female who presents for evaluation of symptoms of a URI. Symptoms include low grade fever, nasal congestion, sore throat and chills. Onset of symptoms was several weeks ago, and has been coming and going since that time. Treatment to date: none.  The following portions of the patient's history were reviewed and updated as appropriate: allergies, current medications, past family history, past medical history, past social history, past surgical history and problem list.  Review of Systems Pertinent items are noted in HPI.   Objective:    BP 110/74  Pulse 68  Temp(Src) 98.2 F (36.8 C) (Oral)  Wt 180 lb (81.647 kg)  BMI 31.89 kg/m2  SpO2 97% General appearance: alert, cooperative, appears stated age and no distress Ears: normal TM&#39;s and external ear canals both ears Nose: clear discharge, moderate congestion, turbinates red, swollen Throat: lips, mucosa, and tongue normal; teeth and gums normal Neck: no adenopathy, supple, symmetrical, trachea midline and thyroid not enlarged, symmetric, no tenderness/mass/nodules Lungs: clear to auscultation bilaterally Heart: S1, S2 normal   Assessment:    allergic rhinitis   Plan:    Discussed diagnosis and treatment of URI. Suggested symptomatic OTC remedies. Nasal saline spray for congestion. Nasal steroids per orders. Follow up as needed. Follow up with PCP in several days or as needed.

## 2012-12-17 NOTE — Patient Instructions (Addendum)
Allergic Rhinitis Allergic rhinitis is when the mucous membranes in the nose respond to allergens. Allergens are particles in the air that cause your body to have an allergic reaction. This causes you to release allergic antibodies. Through a chain of events, these eventually cause you to release histamine into the blood stream (hence the use of antihistamines). Although meant to be protective to the body, it is this release that causes your discomfort, such as frequent sneezing, congestion and an itchy runny nose.  CAUSES  The pollen allergens may come from grasses, trees, and weeds. This is seasonal allergic rhinitis, or "hay fever." Other allergens cause year-round allergic rhinitis (perennial allergic rhinitis) such as house dust mite allergen, pet dander and mold spores.  SYMPTOMS   Nasal stuffiness (congestion).  Runny, itchy nose with sneezing and tearing of the eyes.  There is often an itching of the mouth, eyes and ears. It cannot be cured, but it can be controlled with medications. DIAGNOSIS  If you are unable to determine the offending allergen, skin or blood testing may find it. TREATMENT   Avoid the allergen.  Medications and allergy shots (immunotherapy) can help.  Hay fever may often be treated with antihistamines in pill or nasal spray forms. Antihistamines block the effects of histamine. There are over-the-counter medicines that may help with nasal congestion and swelling around the eyes. Check with your caregiver before taking or giving this medicine. If the treatment above does not work, there are many new medications your caregiver can prescribe. Stronger medications may be used if initial measures are ineffective. Desensitizing injections can be used if medications and avoidance fails. Desensitization is when a patient is given ongoing shots until the body becomes less sensitive to the allergen. Make sure you follow up with your caregiver if problems continue. SEEK MEDICAL  CARE IF:   You develop fever (more than 100.5 F (38.1 C).  You develop a cough that does not stop easily (persistent).  You have shortness of breath.  You start wheezing.  Symptoms interfere with normal daily activities. Document Released: 12/07/2000 Document Revised: 06/06/2011 Document Reviewed: 06/18/2008 [employer] Patient Information 2014 [employer].  

## 2012-12-18 ENCOUNTER — Telehealth: Payer: Self-pay

## 2012-12-18 LAB — VITAMIN B12: Vitamin B-12: 347 pg/mL (ref 211–911)

## 2012-12-18 LAB — HEPATIC FUNCTION PANEL
ALT: 40 U/L — ABNORMAL HIGH (ref 0–35)
Albumin: 3.7 g/dL (ref 3.5–5.2)
Alkaline Phosphatase: 72 U/L (ref 39–117)
Total Protein: 6.5 g/dL (ref 6.0–8.3)

## 2012-12-18 LAB — POCT URINALYSIS DIPSTICK
Blood, UA: NEGATIVE
Glucose, UA: NEGATIVE
Nitrite, UA: NEGATIVE
Protein, UA: NEGATIVE
Urobilinogen, UA: 0.2
pH, UA: 7.5

## 2012-12-18 LAB — CBC WITH DIFFERENTIAL/PLATELET
Basophils Relative: 0.3 % (ref 0.0–3.0)
Eosinophils Absolute: 0.2 10*3/uL (ref 0.0–0.7)
Eosinophils Relative: 2 % (ref 0.0–5.0)
Hemoglobin: 13.9 g/dL (ref 12.0–15.0)
Lymphocytes Relative: 26.2 % (ref 12.0–46.0)
MCHC: 33.9 g/dL (ref 30.0–36.0)
MCV: 93.8 fl (ref 78.0–100.0)
Neutro Abs: 6.5 10*3/uL (ref 1.4–7.7)
RBC: 4.36 Mil/uL (ref 3.87–5.11)

## 2012-12-18 LAB — BASIC METABOLIC PANEL
CO2: 24 mEq/L (ref 19–32)
Chloride: 109 mEq/L (ref 96–112)
Sodium: 141 mEq/L (ref 135–145)

## 2012-12-18 LAB — EPSTEIN-BARR VIRUS VCA ANTIBODY PANEL
EBV EA IgG: <5 U/mL (ref <9.0)
EBV NA IgG: 6.9 U/mL (ref <18.0)
EBV VCA IgM: <10 U/mL (ref <36.0)

## 2012-12-18 NOTE — Telephone Encounter (Signed)
Message copied by Arnette Norris on Tue Dec 18, 2012  8:16 AM ------      Message from: Lelon Perla      Created: Mon Dec 17, 2012  4:38 PM       Please cancel rx at [facility], in [place] ------

## 2012-12-18 NOTE — Telephone Encounter (Signed)
I called the [facility] in [place] and they stated they did not have any prescriptions for this patient in file. Nothing to cancel at this time.     KP

## 2012-12-19 ENCOUNTER — Telehealth: Payer: Self-pay | Admitting: Family Medicine

## 2012-12-19 NOTE — Telephone Encounter (Signed)
Pt is calling back to follow-up on a conversation she just had with Selena Batten this afternoon. She wants to know if she should go to work?

## 2012-12-20 ENCOUNTER — Encounter: Payer: Self-pay | Admitting: General Practice

## 2012-12-20 NOTE — Telephone Encounter (Signed)
Spoke with pt again. Faxed a letter to her work to inform pt is  Not contagious and can return to work  .

## 2013-01-10 ENCOUNTER — Telehealth: Payer: Self-pay | Admitting: *Deleted

## 2013-01-10 NOTE — Telephone Encounter (Signed)
Pt is still feeling bad since her last visit 2 weeks ago. Swollen glands, sore throat, dry cough, left ear pain fatigue and headaches. Patient states that she doesn't know what else to do. Patient was put on the schedule for tomorrow morning. But she wants to know if there is something that can be down without coming in.

## 2013-01-10 NOTE — Telephone Encounter (Signed)
Patient states she cannot take the mucinex or the delsym because it makes her sick on her stomach. Pt states that she will just keep her appointment tomorrow morning

## 2013-01-10 NOTE — Telephone Encounter (Signed)
Use mucinex dm or delsym----but it sounds like we need to check her ears and throat

## 2013-01-11 ENCOUNTER — Encounter: Payer: Self-pay | Admitting: Family Medicine

## 2013-01-11 ENCOUNTER — Ambulatory Visit (INDEPENDENT_AMBULATORY_CARE_PROVIDER_SITE_OTHER): Payer: Federal, State, Local not specified - PPO | Admitting: Family Medicine

## 2013-01-11 VITALS — BP 112/70 | HR 81 | Temp 98.1°F | Wt 179.0 lb

## 2013-01-11 DIAGNOSIS — J029 Acute pharyngitis, unspecified: Secondary | ICD-10-CM

## 2013-01-11 DIAGNOSIS — R300 Dysuria: Secondary | ICD-10-CM

## 2013-01-11 LAB — POCT URINALYSIS DIPSTICK
Bilirubin, UA: NEGATIVE
Blood, UA: NEGATIVE
Glucose, UA: NEGATIVE
Leukocytes, UA: NEGATIVE
Nitrite, UA: NEGATIVE
Urobilinogen, UA: 0.2
pH, UA: 7.5

## 2013-01-11 MED ORDER — PREDNISONE 10 MG PO TABS: ORAL_TABLET | ORAL | Status: DC

## 2013-01-11 MED ORDER — CEFDINIR 300 MG PO CAPS: ORAL_CAPSULE | ORAL | Status: DC

## 2013-01-11 NOTE — Patient Instructions (Signed)

## 2013-01-11 NOTE — Progress Notes (Signed)
  Subjective:     Shannon Mccarthy is a 47 y.o. female who presents for evaluation of sore throat. Associated symptoms include nasal blockage, pain while swallowing, post nasal drip, sinus and nasal congestion, sore throat, swollen glands and dysuria. Onset of symptoms was several weeks ago, and have been gradually worsening since that time. She is drinking plenty of fluids. She has not had a recent close exposure to someone with proven streptococcal pharyngitis.  Her ebv came back early infection.   The following portions of the patient's history were reviewed and updated as appropriate: allergies, current medications, past family history, past medical history, past social history, past surgical history and problem list.  Review of Systems Pertinent items are noted in HPI.    Objective:    BP 112/70  Pulse 81  Temp(Src) 98.1 F (36.7 C) (Oral)  Wt 179 lb (81.194 kg)  BMI 31.72 kg/m2  SpO2 97% General appearance: alert, cooperative, appears stated age and no distress Ears: normal TM's and external ear canals both ears Nose: Nares normal. Septum midline. Mucosa normal. No drainage or sinus tenderness. Throat: abnormal findings: moderate oropharyngeal erythema Neck: moderate anterior cervical adenopathy, supple, symmetrical, trachea midline and thyroid not enlarged, symmetric, no tenderness/mass/nodules Lungs: clear to auscultation bilaterally Heart: S1, S2 normal  Laboratory Strep test not done. Results:culture done and sent .   UA== large Ketones Assessment:    Acute pharyngitis-----for 1 month mono vs bacterial.    Plan:    Patient placed on antibiotics. Use of OTC analgesics recommended as well as salt water gargles. Follow up as needed.

## 2013-01-11 NOTE — Addendum Note (Signed)
Addended by: Arnette Norris on: 01/11/2013 01:57 PM   Modules accepted: Orders

## 2013-01-13 LAB — CULTURE, GROUP A STREP

## 2013-02-25 ENCOUNTER — Encounter: Payer: Self-pay | Admitting: Family Medicine

## 2013-02-25 ENCOUNTER — Ambulatory Visit (INDEPENDENT_AMBULATORY_CARE_PROVIDER_SITE_OTHER): Payer: Federal, State, Local not specified - PPO | Admitting: Family Medicine

## 2013-02-25 VITALS — BP 120/80 | HR 81 | Temp 97.8°F | Wt 180.0 lb

## 2013-02-25 DIAGNOSIS — J019 Acute sinusitis, unspecified: Secondary | ICD-10-CM

## 2013-02-25 MED ORDER — AMOXICILLIN-POT CLAVULANATE 875-125 MG PO TABS: 1.0000 | ORAL_TABLET | Freq: Two times a day (BID) | ORAL | Status: DC

## 2013-02-25 MED ORDER — FLUTICASONE PROPIONATE 50 MCG/ACT NA SUSP: NASAL | Status: AC

## 2013-02-25 NOTE — Progress Notes (Signed)
  Subjective:     Shannon Mccarthy is a 47 y.o. female who presents for evaluation of possible sinusitis. Symptoms include congestion, facial pain, nasal congestion, post nasal drip and sinus pressure. Onset of symptoms was 1 week ago, and has been gradually worsening since that time. Treatment to date: antibiotics, antihistamines and nasal steroids.  The following portions of the patient's history were reviewed and updated as appropriate: allergies, current medications, past family history, past medical history, past social history, past surgical history and problem list.  Review of Systems Pertinent items are noted in HPI.   Objective:    BP 120/80  Pulse 81  Temp(Src) 97.8 F (36.6 C) (Oral)  Wt 180 lb (81.647 kg)  SpO2 93% General appearance: alert, cooperative, appears stated age and no distress Ears: normal TM's and external ear canals both ears Nose: green discharge, moderate congestion, turbinates red, swollen, sinus tenderness bilateral Throat: abnormal findings: moderate oropharyngeal erythema Neck: mild anterior cervical adenopathy, no adenopathy, supple, symmetrical, trachea midline and thyroid not enlarged, symmetric, no tenderness/mass/nodules Lungs: clear to auscultation bilaterally Heart: S1, S2 normal   Assessment:    sinusitis   Plan:    Discussed the diagnosis and treatment of sinusitis. Suggested symptomatic OTC remedies. Nasal saline spray for congestion. Augmentin per orders. Nasal steroids per orders. Follow up as needed.

## 2013-02-25 NOTE — Progress Notes (Signed)
Pre visit review using our clinic review tool, if applicable. No additional management support is needed unless otherwise documented below in the visit note. 

## 2013-02-25 NOTE — Patient Instructions (Signed)

## 2013-08-20 ENCOUNTER — Encounter: Payer: Self-pay | Admitting: Family Medicine

## 2013-08-20 ENCOUNTER — Ambulatory Visit (INDEPENDENT_AMBULATORY_CARE_PROVIDER_SITE_OTHER): Payer: No Typology Code available for payment source | Admitting: Family Medicine

## 2013-08-20 VITALS — BP 108/66 | HR 83 | Temp 98.5°F | Wt 183.0 lb

## 2013-08-20 DIAGNOSIS — L259 Unspecified contact dermatitis, unspecified cause: Secondary | ICD-10-CM

## 2013-08-20 MED ORDER — METHYLPREDNISOLONE ACETATE 80 MG/ML IJ SUSP
80.0000 mg | Freq: Once | INTRAMUSCULAR | Status: AC
  Administered 2013-08-20: 80 mg via INTRAMUSCULAR

## 2013-08-20 MED ORDER — PREDNISONE 10 MG PO TABS: ORAL_TABLET | ORAL | Status: DC

## 2013-08-20 NOTE — Progress Notes (Signed)
Pre visit review using our clinic review tool, if applicable. No additional management support is needed unless otherwise documented below in the visit note. 

## 2013-08-20 NOTE — Patient Instructions (Signed)

## 2013-08-20 NOTE — Progress Notes (Signed)
  Subjective:     Shannon Mccarthy is a 48 y.o. female who presents for evaluation of a rash involving the chest, lower extremity, upper extremity and back. Rash started a few days ago. Lesions are pink, and blistering in texture. Rash has changed over time. Rash is pruritic. Associated symptoms: none. Patient denies: abdominal pain, arthralgia, congestion, cough, crankiness, decrease in appetite, decrease in energy level, fever, headache, irritability, myalgia, nausea, sore throat and vomiting. Patient has not had contacts with similar rash. Patient has had new exposures (soaps, lotions, laundry detergents, foods, medications, plants, insects or animals).---poison ivy  The following portions of the patient's history were reviewed and updated as appropriate: allergies, current medications, past family history, past medical history, past social history, past surgical history and problem list.  Review of Systems Pertinent items are noted in HPI.    Objective:    BP 108/66  Pulse 83  Temp(Src) 98.5 F (36.9 C) (Oral)  Wt 183 lb (83.008 kg)  SpO2 99% General:  alert, cooperative, appears stated age and no distress  Skin:  vesicles noted on extremities and back     Assessment:    contact dermatitis: plants poison ivy    Plan:    Medications: benadryl and steroids: depo medrol and taper. Written patient instruction given. Follow up in a few days. -prn

## 2013-09-05 ENCOUNTER — Encounter: Payer: Self-pay | Admitting: Family Medicine

## 2013-09-05 ENCOUNTER — Ambulatory Visit (INDEPENDENT_AMBULATORY_CARE_PROVIDER_SITE_OTHER): Payer: No Typology Code available for payment source | Admitting: Family Medicine

## 2013-09-05 VITALS — BP 100/60 | HR 94 | Temp 98.4°F | Wt 181.0 lb

## 2013-09-05 DIAGNOSIS — L255 Unspecified contact dermatitis due to plants, except food: Secondary | ICD-10-CM

## 2013-09-05 DIAGNOSIS — L247 Irritant contact dermatitis due to plants, except food: Secondary | ICD-10-CM

## 2013-09-05 MED ORDER — PREDNISONE 10 MG PO TABS
ORAL_TABLET | ORAL | Status: DC
Start: 2013-09-05 — End: 2013-12-12

## 2013-09-05 NOTE — Progress Notes (Signed)
Pre visit review using our clinic review tool, if applicable. No additional management support is needed unless otherwise documented below in the visit note. 

## 2013-09-05 NOTE — Progress Notes (Signed)
  Subjective:     Shannon Mccarthy is a 48 y.o. female who presents for evaluation of a rash involving the chest, forearm and shoulder. Rash started a few days ago. Lesions are pink, and blistering in texture. Rash has changed over time. Rash is pruritic. Associated symptoms: none. Patient denies: abdominal pain, arthralgia, congestion, cough, crankiness, decrease in appetite, decrease in energy level, fever, headache, irritability, myalgia, nausea, sore throat and vomiting. Patient has not had contacts with similar rash. Patient has had new exposures (soaps, lotions, laundry detergents, foods, medications, plants, insects or animals). Rash never completely resolved from last visit.  She was not working in yard this weekend--her husband is a Administrator.    Review of Systems Pertinent items are noted in HPI.    Objective:    BP 100/60  Pulse 94  Temp(Src) 98.4 F (36.9 C) (Oral)  Wt 181 lb (82.101 kg)  SpO2 95% General:  alert, cooperative, appears stated age and no distress  Skin:  vesicles noted on extremities and trunk     Assessment:    contact dermatitis: plants poison ivy    Plan:    Medications: steroids: pred taper. Written patient instruction given. Follow up in a few days. --prn

## 2013-09-05 NOTE — Patient Instructions (Signed)

## 2013-12-12 ENCOUNTER — Ambulatory Visit (INDEPENDENT_AMBULATORY_CARE_PROVIDER_SITE_OTHER): Payer: No Typology Code available for payment source | Admitting: Family Medicine

## 2013-12-12 ENCOUNTER — Ambulatory Visit (HOSPITAL_BASED_OUTPATIENT_CLINIC_OR_DEPARTMENT_OTHER)
Admission: RE | Admit: 2013-12-12 | Discharge: 2013-12-12 | Disposition: A | Discharge status: Home / Self Care | Payer: No Typology Code available for payment source | Source: Ambulatory Visit | Attending: Family Medicine | Admitting: Family Medicine

## 2013-12-12 ENCOUNTER — Encounter: Payer: Self-pay | Admitting: Family Medicine

## 2013-12-12 VITALS — BP 108/75 | HR 83 | Temp 97.9°F | Wt 182.1 lb

## 2013-12-12 DIAGNOSIS — R059 Cough, unspecified: Secondary | ICD-10-CM

## 2013-12-12 DIAGNOSIS — R05 Cough: Secondary | ICD-10-CM | POA: Diagnosis not present

## 2013-12-12 DIAGNOSIS — R21 Rash and other nonspecific skin eruption: Secondary | ICD-10-CM

## 2013-12-12 MED ORDER — METHYLPREDNISOLONE ACETATE 80 MG/ML IJ SUSP
80.0000 mg | Freq: Once | INTRAMUSCULAR | Status: AC
  Administered 2013-12-12: 80 mg via INTRAMUSCULAR

## 2013-12-12 MED ORDER — PREDNISONE 10 MG PO TABS: ORAL_TABLET | ORAL | Status: DC

## 2013-12-12 MED ORDER — PREDNISONE 10 MG PO TABS: 10.0000 mg | ORAL_TABLET | Freq: Every day | ORAL | Status: DC

## 2013-12-12 MED ORDER — PREDNISONE 10 MG PO TABS: ORAL_TABLET | ORAL | Status: AC

## 2013-12-12 NOTE — Patient Instructions (Signed)
Try to decrease stress level at work rto if no better

## 2013-12-12 NOTE — Progress Notes (Signed)
Pre visit review using our clinic review tool, if applicable. No additional management support is needed unless otherwise documented below in the visit note. 

## 2013-12-12 NOTE — Progress Notes (Signed)
   Subjective:    Patient ID: Shannon Mccarthy, female    DOB: 09/20/1965, 48 y.o.   MRN: [id number]  HPI Pt is here c/o rash on face , under breast and on thighs since her boss yelled at her in the office.  She feels she is trying to get her fired.  + pruritic,  No fever, no new soaps/ detergents, foods etc   Review of Systems    as above Objective:   Physical Exam BP 108/75  Pulse 83  Temp(Src) 97.9 F (36.6 C) (Oral)  Wt 182 lb 1.6 oz (82.6 kg)  SpO2 99%  LMP 11/30/2013 General appearance: alert, cooperative, appears stated age and no distress Neck: no adenopathy, no JVD, supple, symmetrical, trachea midline and thyroid not enlarged, symmetric, no tenderness/mass/nodules Lungs: clear to auscultation bilaterally Heart: S1, S2 normal Skin: papules on face, under R breast and both thighs       Assessment & Plan:  1. Rash Maybe secondary to her extreme stress levels-- pt has not been in contact with any thing new, no traveling etc Depo medrol - methylPREDNISolone acetate (DEPO-MEDROL) injection 80 mg; Inject 1 mL (80 mg total) into the muscle once.  2. Cough   - DG Chest 2 View; Future
